# Patient Record
Sex: Male | Born: 1990 | Race: White | Hispanic: No | Marital: Single
Health system: Southern US, Community
[De-identification: ages and names within clinical notes are randomized; demographics above are authoritative.]

---

## 2010-03-23 ENCOUNTER — Emergency Department: Payer: Self-pay | Admitting: Emergency Medicine

## 2011-09-09 IMAGING — CT CT ABD-PELV W/O CM
1 of 2 series · 15 of 32 positions shown, 19 images · non-contrast
Comparison: none

REASON FOR EXAM: (1) lower abdominal pain; (2) lower abdominal pain
COMMENTS:   May transport without cardiac monitor

[Series 2: stone · axial · 0.58mm/px · z∈[+98,+497]mm · 15 of 145 slices shown, 19 images]
[im 6/145  soft-tissue]
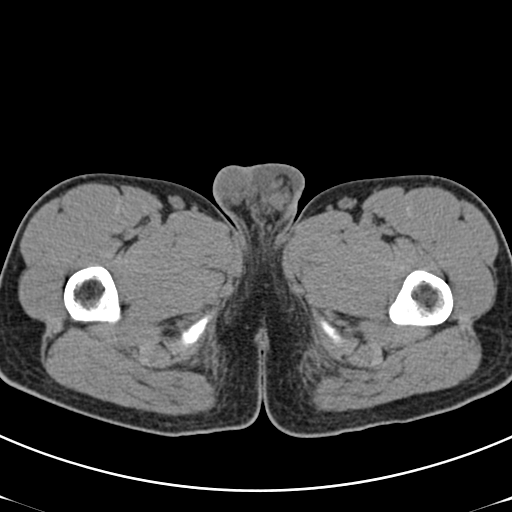
[im 6/145  bone]
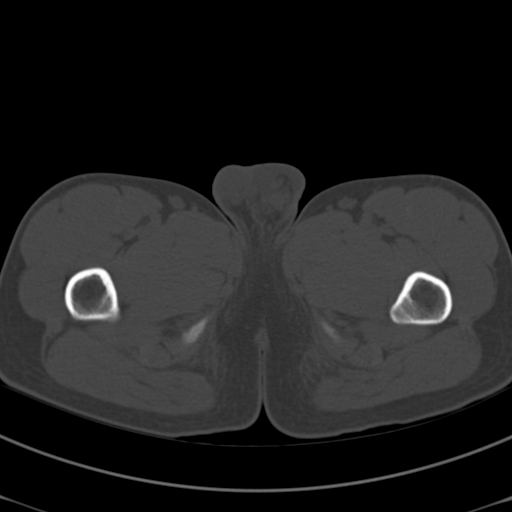
[im 17/145  soft-tissue]
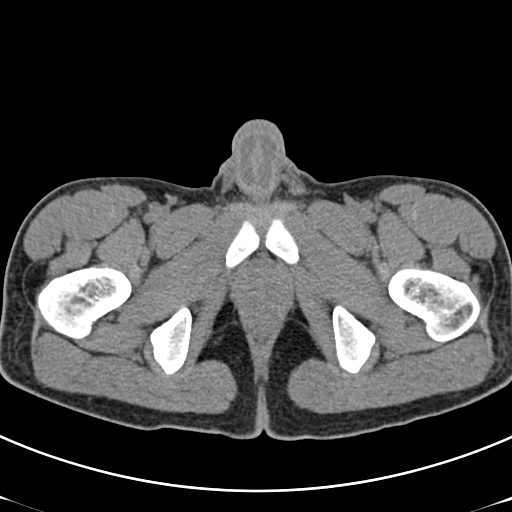
[im 28/145  soft-tissue]
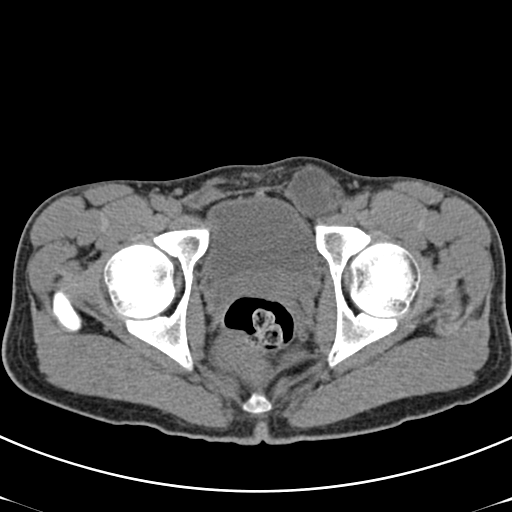
[im 39/145  soft-tissue]
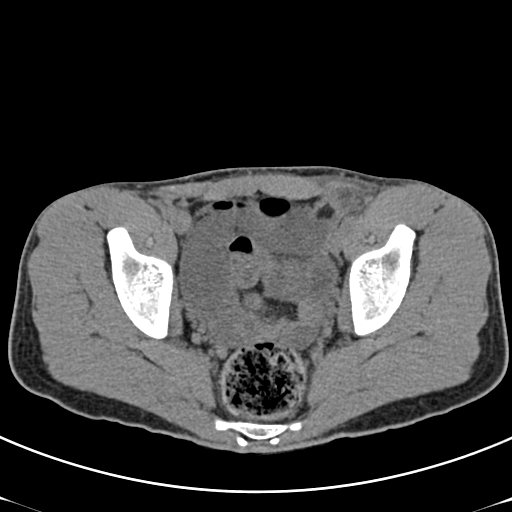
[im 50/145  soft-tissue]
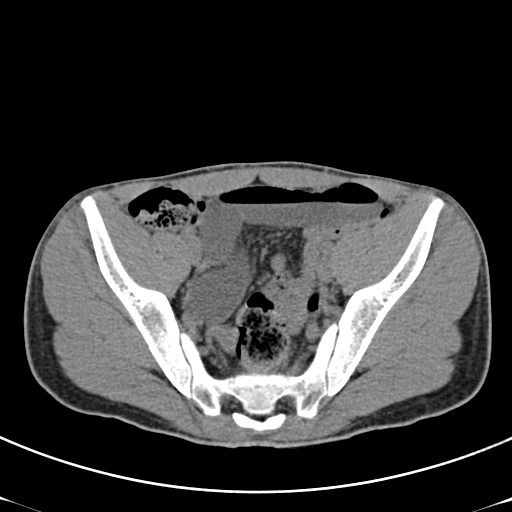
[im 61/145  soft-tissue]
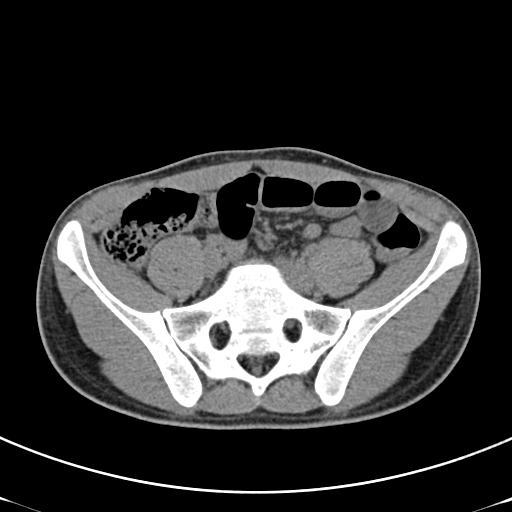
[im 73/145  soft-tissue]
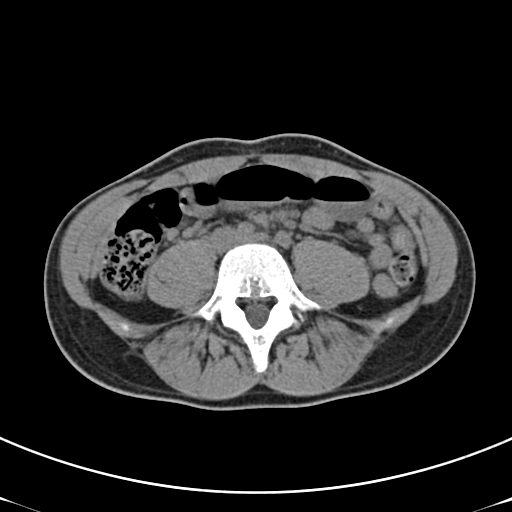
[im 84/145  soft-tissue]
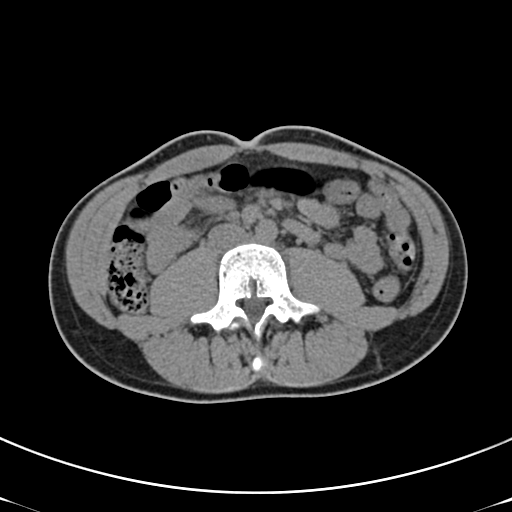
[im 95/145  soft-tissue]
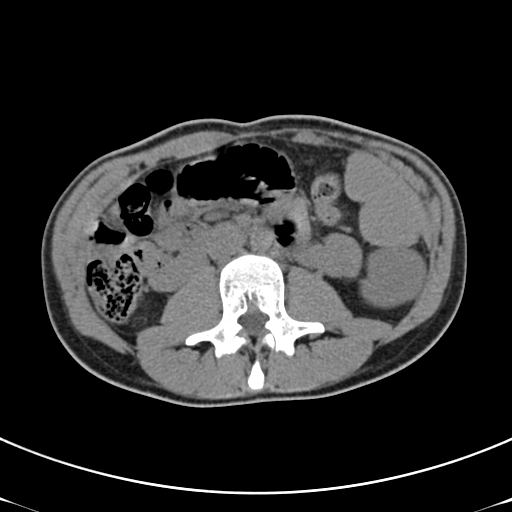
[im 95/145  bone]
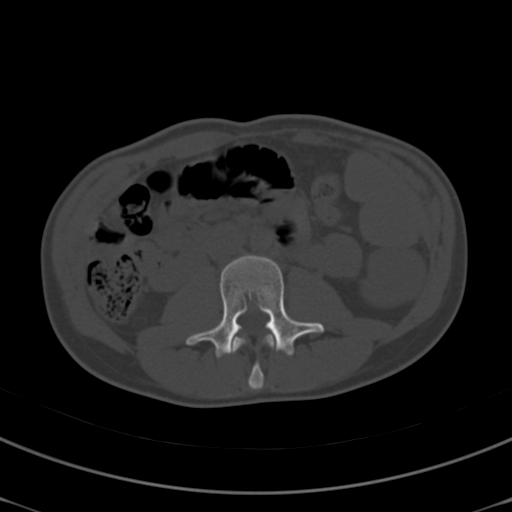
[im 106/145  soft-tissue]
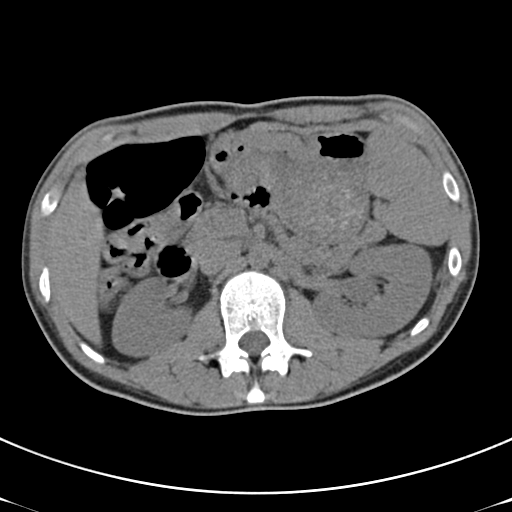
[im 117/145  soft-tissue]
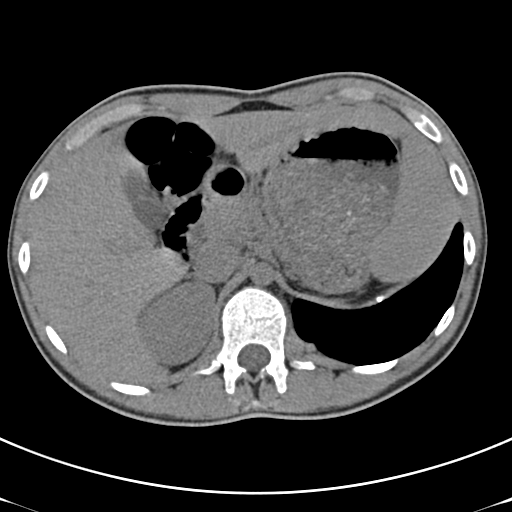
[im 122/145  lung]
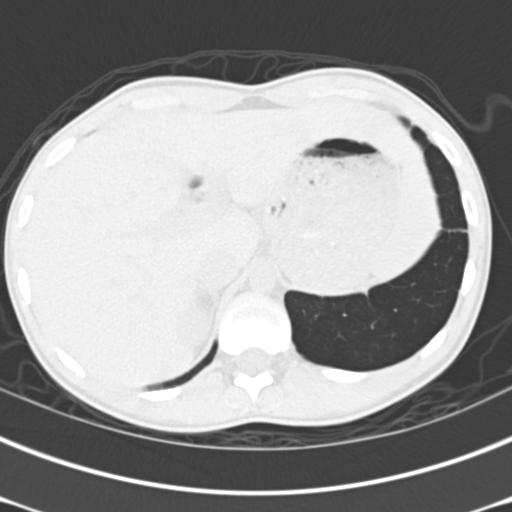
[im 128/145  soft-tissue]
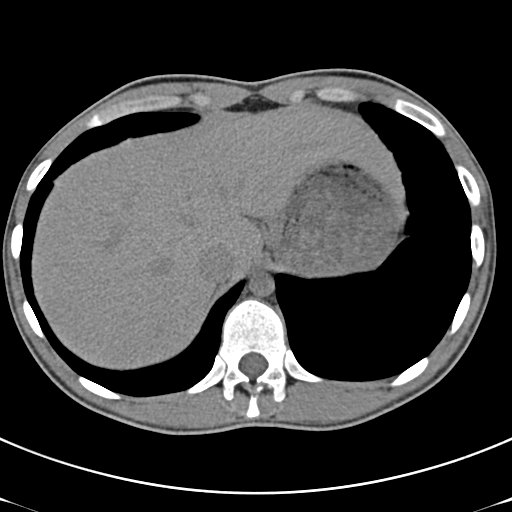
[im 128/145  lung]
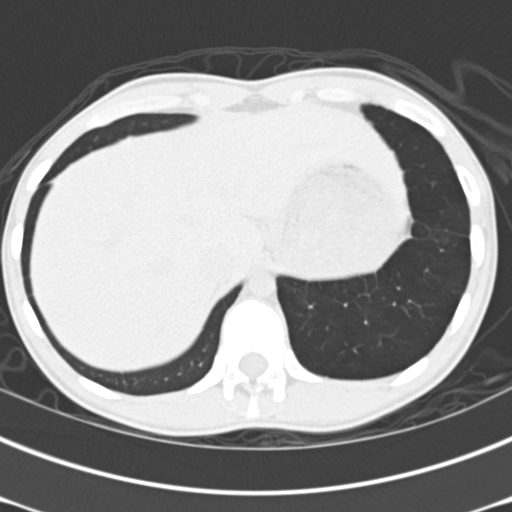
[im 133/145  lung]
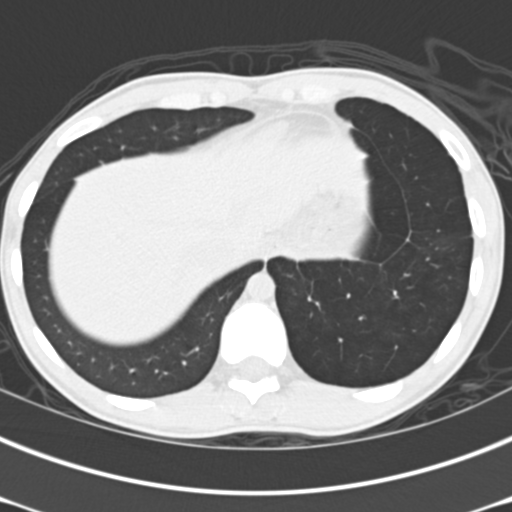
[im 139/145  soft-tissue]
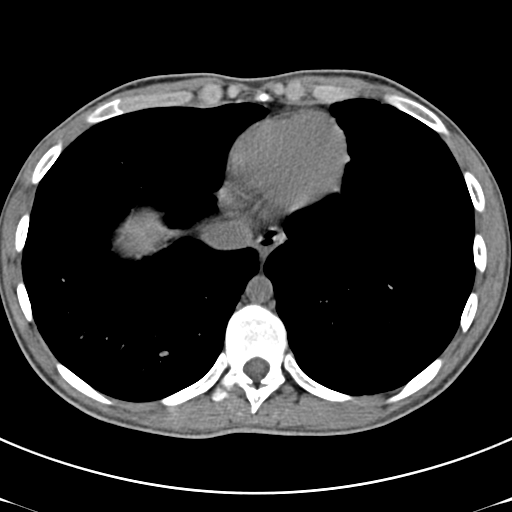
[im 139/145  lung]
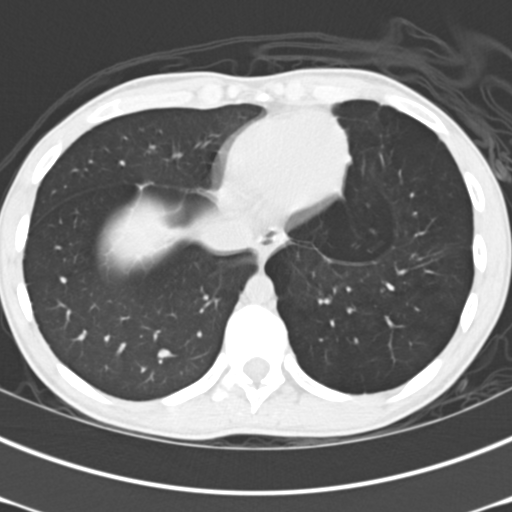

[15 of 32 positions shown; findings below may reference images not displayed]

PROCEDURE:     CT  - CT ABDOMEN AND PELVIS W[DATE]  [DATE]

RESULT:

Helical non-contrast 3 mm sections were obtained from the lung bases through
the pubic symphysis.

Evaluation of the lung bases demonstrates findings consistent with bullous
disease within the medial base of the lingula. No focal regions of
consolidation or focal infiltrates are appreciated.

Non-contrast evaluation of the liver, spleen, adrenals, pancreas, and
kidneys are unremarkable. Distended air and fluid-filled loops of small
bowel are appreciated. A left inguinal hernia containing a loop of small
bowel is identified. There is no evidence of free air, free fluid or
loculated fluid collections within the abdomen or pelvis. There is no
evidence of abdominal or pelvic masses. There is no evidence of an abdominal
aortic aneurysm. A moderate amount of stool is appreciated within the colon.
IMPRESSION: Left inguinal hernia containing loop of small bowel. There
appears to be an associated partial or early small bowel obstruction.

Dr. Gahaa of the Emergency Department was informed of these findings via a
preliminary faxed report.

## 2019-10-15 ENCOUNTER — Ambulatory Visit: Payer: Medicare Other | Attending: Internal Medicine

## 2019-10-15 DIAGNOSIS — Z23 Encounter for immunization: Secondary | ICD-10-CM

## 2019-10-15 NOTE — Progress Notes (Signed)
   Covid-[facility] Vaccination [facility]  Name:  Benjamin Norris    MRN: [id number] DOB: [birth year]  10/15/2019  Mr. Monda was observed post Covid-19 immunization for 15 minutes without incident. He was provided with Vaccine Information Sheet and instruction to access the V-Safe system.   Mr. Hayhurst was instructed to call 911 with any severe reactions post vaccine: Marland Kitchen Difficulty breathing  . Swelling of face and throat  . A fast heartbeat  . A bad rash all over body  . Dizziness and weakness   Immunizations Administered    Name Date Dose VIS Date Route   Pfizer COVID-19 Vaccine 10/15/2019  8:30 AM 0.3 mL 06/25/2019 Intramuscular   Manufacturer: [employer], [employer]   Lot: [id number]   NDC: [address]-9

## 2019-11-09 ENCOUNTER — Ambulatory Visit: Payer: Medicare Other | Attending: Internal Medicine

## 2019-11-09 DIAGNOSIS — Z23 Encounter for immunization: Secondary | ICD-10-CM

## 2019-11-09 NOTE — Progress Notes (Signed)
   Covid-19 Vaccination Clinic  Name:  ANGELGABRIEL WILLMORE    MRN: [id number] DOB: [birth year]  11/09/2019  Mr. Ciszek was observed post Covid-19 immunization for 15 minutes without incident. He was provided with Vaccine Information Sheet and instruction to access the V-Safe system.   Mr. Benavides was instructed to call 911 with any severe reactions post vaccine: Marland Kitchen Difficulty breathing  . Swelling of face and throat  . A fast heartbeat  . A bad rash all over body  . Dizziness and weakness   Immunizations Administered    Name Date Dose VIS Date Route   Pfizer COVID-19 Vaccine 11/09/2019 10:43 AM 0.3 mL 09/08/2018 Intramuscular   Manufacturer: [employer], [employer]   Lot: [id number]   NDC: [address]-2
